# Patient Record
Sex: Female | Born: 1980 | Race: White | Hispanic: No | Marital: Married | Smoking: Never smoker
Health system: Southern US, Community
[De-identification: ages and names within clinical notes are randomized; demographics above are authoritative.]

---
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
## 2002-01-15 ENCOUNTER — Encounter: Payer: Self-pay | Admitting: Emergency Medicine

## 2002-01-15 ENCOUNTER — Observation Stay (HOSPITAL_COMMUNITY): Admission: EM | Admit: 2002-01-15 | Discharge: 2002-01-16 | Payer: Self-pay | Admitting: Emergency Medicine

## 2010-03-17 ENCOUNTER — Emergency Department (HOSPITAL_COMMUNITY)
Admission: EM | Admit: 2010-03-17 | Discharge: 2010-03-17 | Payer: Self-pay | Source: Home / Self Care | Admitting: Emergency Medicine

## 2010-06-26 LAB — POCT I-STAT, CHEM 8
BUN: 25 mg/dL — ABNORMAL HIGH (ref 6–23)
Calcium, Ion: 1.18 mmol/L (ref 1.12–1.32)
Chloride: 104 mEq/L (ref 96–112)
Creatinine, Ser: 1 mg/dL (ref 0.4–1.2)
Glucose, Bld: 108 mg/dL — ABNORMAL HIGH (ref 70–99)
HCT: 41 % (ref 36.0–46.0)
Hemoglobin: 13.9 g/dL (ref 12.0–15.0)
Potassium: 4.1 mEq/L (ref 3.5–5.1)
Sodium: 141 mEq/L (ref 135–145)
TCO2: 31 mmol/L (ref 0–100)

## 2010-06-26 LAB — CBC
HCT: 38.8 % (ref 36.0–46.0)
Hemoglobin: 12.9 g/dL (ref 12.0–15.0)
MCH: 31.2 pg (ref 26.0–34.0)
MCHC: 33.2 g/dL (ref 30.0–36.0)
MCV: 93.9 fL (ref 78.0–100.0)
Platelets: 251 10*3/uL (ref 150–400)
RBC: 4.13 MIL/uL (ref 3.87–5.11)
RDW: 12.4 % (ref 11.5–15.5)
WBC: 6.2 10*3/uL (ref 4.0–10.5)

## 2010-06-26 LAB — DIFFERENTIAL
Basophils Absolute: 0.1 10*3/uL (ref 0.0–0.1)
Basophils Relative: 2 % — ABNORMAL HIGH (ref 0–1)
Eosinophils Absolute: 0.2 10*3/uL (ref 0.0–0.7)
Eosinophils Relative: 3 % (ref 0–5)
Lymphocytes Relative: 29 % (ref 12–46)
Lymphs Abs: 1.8 10*3/uL (ref 0.7–4.0)
Monocytes Absolute: 0.5 10*3/uL (ref 0.1–1.0)
Monocytes Relative: 7 % (ref 3–12)
Neutro Abs: 3.6 10*3/uL (ref 1.7–7.7)
Neutrophils Relative %: 59 % (ref 43–77)

## 2010-06-26 LAB — URINALYSIS, ROUTINE W REFLEX MICROSCOPIC
Bilirubin Urine: NEGATIVE
Leukocytes, UA: NEGATIVE
Nitrite: NEGATIVE
Specific Gravity, Urine: 1.023 (ref 1.005–1.030)
pH: 6.5 (ref 5.0–8.0)

## 2010-06-26 LAB — D-DIMER, QUANTITATIVE: D-Dimer, Quant: <0.22 ug/mL-FEU (ref 0.00–0.48)

## 2010-06-26 LAB — URINE MICROSCOPIC-ADD ON

## 2010-09-01 NOTE — Op Note (Signed)
   NAMERANDILYN, FOISY NO.:  [id number]   MEDICAL RECORD NO.:  [id number]                   PATIENT TYPE:  INP   LOCATION:  0457                                 FACILITY:  [facility]   PHYSICIAN:  Roseanna Rainbow, M.D.         DATE OF BIRTH:  29-Jun-1980   DATE OF PROCEDURE:  01/15/2002  DATE OF DISCHARGE:  01/16/2002                                 OPERATIVE REPORT   PREOPERATIVE DIAGNOSIS:  Vaginal laceration.   POSTOPERATIVE DIAGNOSIS:  Vaginal laceration.   PROCEDURE:  Repair of vaginal laceration.   SURGEON:  Roseanna Rainbow, M.D.   ANESTHESIA:  Laryngeal mask airway.   COMPLICATIONS:  None.   ESTIMATED BLOOD LOSS:  Less than 50 cc secondary to the procedure.   FINDINGS:  Blood clot and hematoma noted at the vaginal apex.  After this  was evacuated, there was a several centimeter transverse tear in the vaginal  apex.  There were generalized oozing from the vaginal epithelial edges;  however, there were no active bleeding sites noted.  The cervix was absent.   DESCRIPTION OF PROCEDURE:  The patient was taken to the operating room.  A  sterile weighted speculum was placed in the patient's vagina, and Deavers  were used for retraction.  The hematoma was evacuated as described above  with suction.  The lacerated vaginal epithelial edges were then  reapproximated using interrupted figure-of-eight sutures of 2-0 Vicryl.  The  vagina was then irrigated.  Adequate hemostasis was noted.  The patient  tolerated the procedure well.  The patient was taken to the PACU in stable  condition.                                               Roseanna Rainbow, M.D.    Judee Clara  D:  01/16/2002  T:  01/16/2002  Job:  [id number]

## 2019-05-08 ENCOUNTER — Other Ambulatory Visit: Payer: Self-pay

## 2019-05-08 ENCOUNTER — Ambulatory Visit (INDEPENDENT_AMBULATORY_CARE_PROVIDER_SITE_OTHER): Payer: Self-pay | Admitting: Pediatrics

## 2019-05-08 DIAGNOSIS — Z7681 Expectant parent(s) prebirth pediatrician visit: Secondary | ICD-10-CM

## 2019-05-10 DIAGNOSIS — Z7681 Expectant parent(s) prebirth pediatrician visit: Secondary | ICD-10-CM | POA: Insufficient documentation

## 2019-05-10 NOTE — Progress Notes (Signed)
Prenatal counseling for impending newborn done-- Z76.81  

## 2019-07-07 ENCOUNTER — Other Ambulatory Visit: Payer: Self-pay

## 2019-07-07 ENCOUNTER — Emergency Department (HOSPITAL_BASED_OUTPATIENT_CLINIC_OR_DEPARTMENT_OTHER)
Admission: EM | Admit: 2019-07-07 | Discharge: 2019-07-07 | Disposition: A | Discharge status: Home / Self Care | Payer: BC Managed Care – PPO | Attending: Emergency Medicine | Admitting: Emergency Medicine

## 2019-07-07 ENCOUNTER — Emergency Department (HOSPITAL_BASED_OUTPATIENT_CLINIC_OR_DEPARTMENT_OTHER): Payer: BC Managed Care – PPO

## 2019-07-07 ENCOUNTER — Encounter (HOSPITAL_BASED_OUTPATIENT_CLINIC_OR_DEPARTMENT_OTHER): Payer: Self-pay | Admitting: *Deleted

## 2019-07-07 DIAGNOSIS — R591 Generalized enlarged lymph nodes: Secondary | ICD-10-CM

## 2019-07-07 DIAGNOSIS — R599 Enlarged lymph nodes, unspecified: Secondary | ICD-10-CM | POA: Diagnosis not present

## 2019-07-07 DIAGNOSIS — Z8543 Personal history of malignant neoplasm of ovary: Secondary | ICD-10-CM | POA: Diagnosis not present

## 2019-07-07 DIAGNOSIS — R221 Localized swelling, mass and lump, neck: Secondary | ICD-10-CM | POA: Diagnosis present

## 2019-07-07 LAB — CBC WITH DIFFERENTIAL/PLATELET
Abs Immature Granulocytes: 0.01 10*3/uL (ref 0.00–0.07)
Basophils Absolute: 0.1 10*3/uL (ref 0.0–0.1)
Basophils Relative: 1 %
Eosinophils Absolute: 0.1 10*3/uL (ref 0.0–0.5)
Eosinophils Relative: 2 %
HCT: 40.8 % (ref 36.0–46.0)
Hemoglobin: 13.4 g/dL (ref 12.0–15.0)
Immature Granulocytes: 0 %
Lymphocytes Relative: 32 %
Lymphs Abs: 1.9 10*3/uL (ref 0.7–4.0)
MCH: 30.7 pg (ref 26.0–34.0)
MCHC: 32.8 g/dL (ref 30.0–36.0)
MCV: 93.4 fL (ref 80.0–100.0)
Monocytes Absolute: 0.4 10*3/uL (ref 0.1–1.0)
Monocytes Relative: 7 %
Neutro Abs: 3.3 10*3/uL (ref 1.7–7.7)
Neutrophils Relative %: 58 %
Platelets: 233 10*3/uL (ref 150–400)
RBC: 4.37 MIL/uL (ref 3.87–5.11)
RDW: 11.9 % (ref 11.5–15.5)
WBC: 5.8 10*3/uL (ref 4.0–10.5)
nRBC: 0 % (ref 0.0–0.2)

## 2019-07-07 LAB — COMPREHENSIVE METABOLIC PANEL
ALT: 22 U/L (ref 0–44)
AST: 29 U/L (ref 15–41)
Albumin: 4.8 g/dL (ref 3.5–5.0)
Alkaline Phosphatase: 56 U/L (ref 38–126)
Anion gap: 11 (ref 5–15)
BUN: 11 mg/dL (ref 6–20)
CO2: 25 mmol/L (ref 22–32)
Calcium: 9.5 mg/dL (ref 8.9–10.3)
Chloride: 104 mmol/L (ref 98–111)
Creatinine, Ser: 0.83 mg/dL (ref 0.44–1.00)
GFR calc Af Amer: >60 mL/min (ref >60)
GFR calc non Af Amer: >60 mL/min (ref >60)
Glucose, Bld: 89 mg/dL (ref 70–99)
Potassium: 3.7 mmol/L (ref 3.5–5.1)
Sodium: 140 mmol/L (ref 135–145)
Total Bilirubin: 0.8 mg/dL (ref 0.3–1.2)
Total Protein: 7.7 g/dL (ref 6.5–8.1)

## 2019-07-07 MED ORDER — IOHEXOL 300 MG/ML  SOLN
100.0000 mL | Freq: Once | INTRAMUSCULAR | Status: AC
  Administered 2019-07-07: 20:00:00 100 mL via INTRAVENOUS

## 2019-07-07 NOTE — ED Notes (Signed)
2 unsuccessful IV attempts. Misty RN notified

## 2019-07-07 NOTE — Progress Notes (Signed)
CT pending lab results  

## 2019-07-07 NOTE — ED Triage Notes (Signed)
She has a swollen node in the left side of her neck. States she got a TDAP in her left arm prior. Hx of ovarian cancer.

## 2019-07-07 NOTE — Discharge Instructions (Signed)
You are seen today for an enlarged lymph node.  Your work-up was reassuring including a normal metabolic panel and complete blood count.  You did have some nonpathologically enlarged lymph nodes in your neck and supraclavicular area.  He also had a borderline left axillary lymph node.  This is all still likely reactive to the vaccines a received in your left arm.  However, given your family history we will refer you to IR.  It is still okay to watch and wait and follow-up with primary care doctor if you feel comfortable.  Otherwise call IR for possible biopsy. Thank you for allowing me to care for you today. Please return to the emergency department if you have new or worsening symptoms. Take your medications as instructed.

## 2019-07-07 NOTE — ED Provider Notes (Signed)
[facility] EMERGENCY DEPARTMENT Provider Note   CSN: [id number] Arrival date & time: 07/07/19  1747     History Chief Complaint  Patient presents with  . Swollen Lymph Node    Erika Hall is a 39 y.o. female.  Patient is a 39 year old female with a past medical history that is significant for ovarian cancer at the age of 39 years old, and a strong family history of various cancers including lymphoma, leukemia, liver cancer with multiple familial deaths at young ages presenting to the emergency department for lymphadenopathy.  Patient reports that she frequently does have some neck exam on herself to feel for lymphadenopathy given her history and family history.  Reports that yesterday she felt a small supraclavicular lymph node on the left side.  She does admit that she had a tetanus shot last Friday in the left arm.  She reports feeling postnasal drip chronically but has been evaluated by ENT in the past for the same.  Patient reports being very worrisome that she may have a metastatic or cancerous process going on due to the lymph node.  She denies any chest pain, shortness of breath, cough.  Reports a 15 pound weight loss since January but this was intentional.        History reviewed. No pertinent past medical history.  Patient Active Problem List   Diagnosis Date Noted  . Pediatric pre-birth visit 05/10/2019    History reviewed. No pertinent surgical history.   OB History   No obstetric history on file.     No family history on file.  Social History   Tobacco Use  . Smoking status: Never Smoker  . Smokeless tobacco: Never Used  Substance Use Topics  . Alcohol use: Not on file  . Drug use: Not on file    Home Medications Prior to Admission medications   Not on File    Allergies    Patient has no known allergies.  Review of Systems   Review of Systems  Constitutional: Negative for appetite change, chills and fever.  HENT: Negative for  congestion and sore throat.   Respiratory: Negative for cough and shortness of breath.   Gastrointestinal: Negative for abdominal pain and vomiting.  Musculoskeletal: Negative for arthralgias and neck pain.  Skin: Negative for rash.  Neurological: Negative for dizziness and headaches.  Hematological: Positive for adenopathy.    Physical Exam Updated Vital Signs BP 118/87   Pulse 92   Temp 97.9 F (36.6 C) (Oral)   Resp 16   Ht 5\' 11"  (1.803 m)   Wt 87 kg   SpO2 98%   BMI 26.75 kg/m   Physical Exam Vitals and nursing note reviewed.  Constitutional:      General: She is not in acute distress.    Appearance: Normal appearance. She is not ill-appearing, toxic-appearing or diaphoretic.  HENT:     Head: Normocephalic.     Mouth/Throat:     Mouth: Mucous membranes are moist.     Pharynx: Oropharynx is clear.  Eyes:     Conjunctiva/sclera: Conjunctivae normal.  Neck:     Comments: Left side supracervical  Lymphadenopathy.  Lymph node is mobile, mildly tender and about 1 cm circumferentially. Cardiovascular:     Rate and Rhythm: Normal rate and regular rhythm.  Pulmonary:     Effort: Pulmonary effort is normal.     Breath sounds: Normal breath sounds.  Skin:    General: Skin is dry.  Neurological:  Mental Status: She is alert.  Psychiatric:        Mood and Affect: Mood normal.     ED Results / Procedures / Treatments   Labs (all labs ordered are listed, but only abnormal results are displayed) Labs Reviewed  COMPREHENSIVE METABOLIC PANEL  CBC WITH DIFFERENTIAL/PLATELET    EKG None  Radiology No results found.  Procedures Procedures (including critical care time)  Medications Ordered in ED Medications - No data to display  ED Course  I have reviewed the triage vital signs and the nursing notes.  Pertinent labs & imaging results that were available during my care of the patient were reviewed by me and considered in my medical decision making (see chart  for details).  Clinical Course as of Jul 07 2111  Tue Jul 07, 2019  1884 Patient with strong personal and family history of cancer presenting to the emergency department for lymphadenopathy supraclavicular found yesterday.  Patient is very concerned about a cancerous process.  We had a shared decision making conversation.  I reassured the patient that since this is new and since she recently had a tetanus shot it would be reasonable to watch and wait.  She is not having any other significant constitutional symptoms.  However, patient noted that she would like to have labs and CT scans done today.  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 She would like to proceed with labs and CT neck and chest.  I advised the patient that if work-up negative she will still need follow-up with primary care doctor and if lymph node is persistent may need biopsy in the future   [KM]  2108 Patient work-up was reassuring including normal CBC and CMP.  CT soft tissue of the neck reading lymph nodes that are not pathologically enlarged but are more prominent than the right side.  There is a left axillary lymph node which is borderline prominent.  I reassured the patient that I believe that this is also reactive from tetanus shot in the left arm.  Advised the patient that watching and waiting is still advisable.  Discussed with Dr. Lockie Mola.  We will give the patient the referral information for IR so she may follow-up with them for possible biopsy in the future.  Advised on strict return precautions.   [KM]    Clinical Course User Index [KM] Jeral Pinch   MDM Rules/Calculators/A&P                      Based on review of vitals, medical screening exam, lab work and/or imaging, there does not appear to be an acute, emergent etiology for the patient's symptoms. Counseled pt on good return precautions and encouraged both PCP and ED  follow-up as needed.  Prior to discharge, I also discussed incidental imaging findings with patient in detail and advised appropriate, recommended follow-up in detail.  Clinical Impression: 1. Lymphadenopathy     Disposition: Discharge  Prior to providing a prescription for a controlled substance, I independently reviewed the patient's recent prescription history on the [place] Controlled Substance Reporting System. The patient had no recent or regular prescriptions and was deemed appropriate for a brief, less than 3 day prescription of narcotic for acute analgesia.  This note was prepared with assistance of Conservation officer, historic buildings. Occasional wrong-word or sound-a-like substitutions may have occurred due to the inherent limitations of voice  recognition software.  Final Clinical Impression(s) / ED Diagnoses Final diagnoses:  None    Rx / DC Orders ED Discharge Orders    None       Kristine Royal 07/07/19 2114    Lennice Sites, DO 07/07/19 2312

## 2019-07-24 ENCOUNTER — Inpatient Hospital Stay (HOSPITAL_COMMUNITY): Admission: RE | Admit: 2019-07-24 | Payer: BC Managed Care – PPO | Source: Home / Self Care

## 2021-09-08 IMAGING — CT CT CHEST W/ CM
2 of 3 series · 15 of 36 positions shown, 18 images · IV contrast (Omnipaque)
Comparison: None.

CLINICAL DATA: Swollen lymph node in left side of neck

EXAM:
CT CHEST WITH CONTRAST
TECHNIQUE: Multidetector CT imaging of the chest was performed during
intravenous contrast administration.
CONTRAST:  100mL OMNIPAQUE IOHEXOL 300 MG/ML  SOLN

[Series 3: axial st · axial · 0.73mm/px · z∈[+493,+791]mm · 12 of 177 slices shown, 15 images]
[im 14/177  mediastinal]
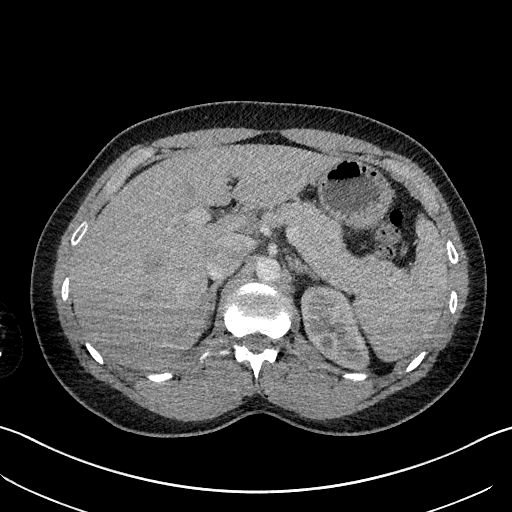
[im 14/177  lung]
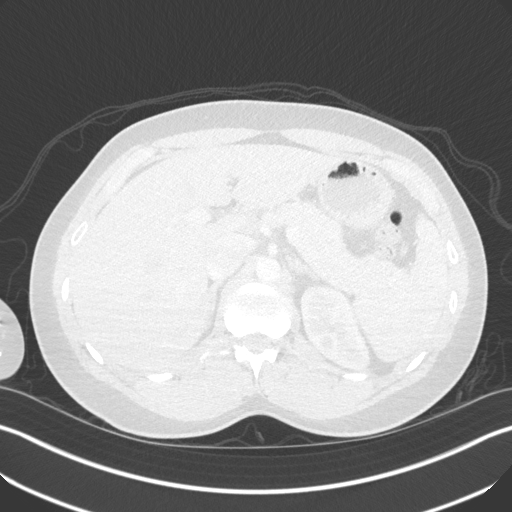
[im 27/177  lung]
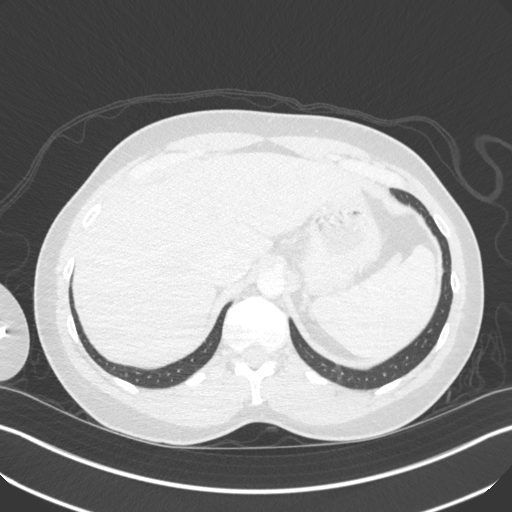
[im 40/177  lung]
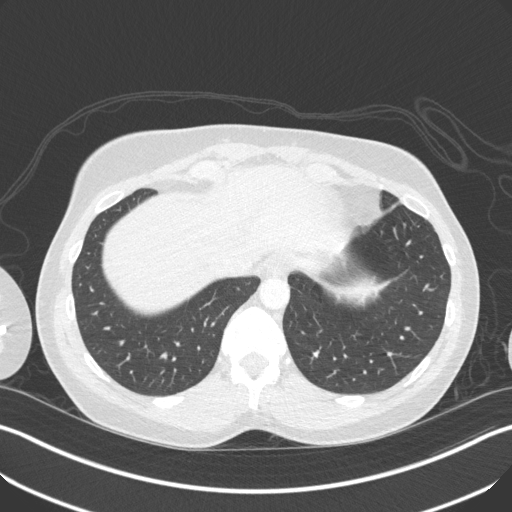
[im 53/177  lung]
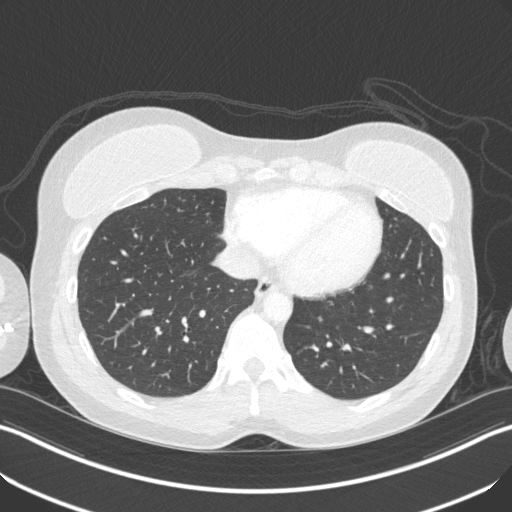
[im 66/177  mediastinal]
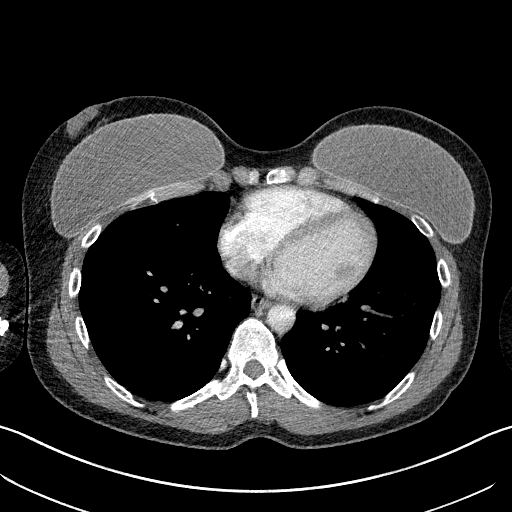
[im 66/177  lung]
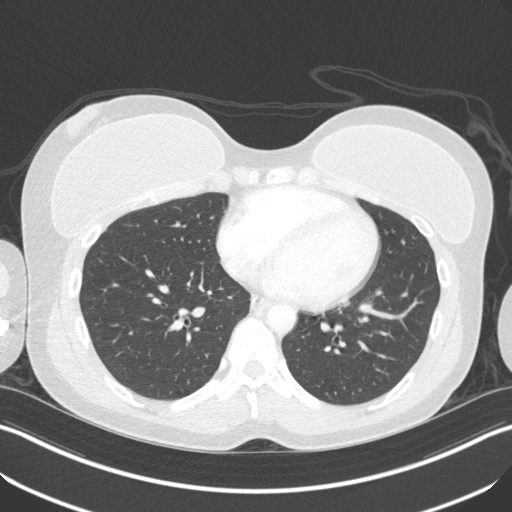
[im 79/177  lung]
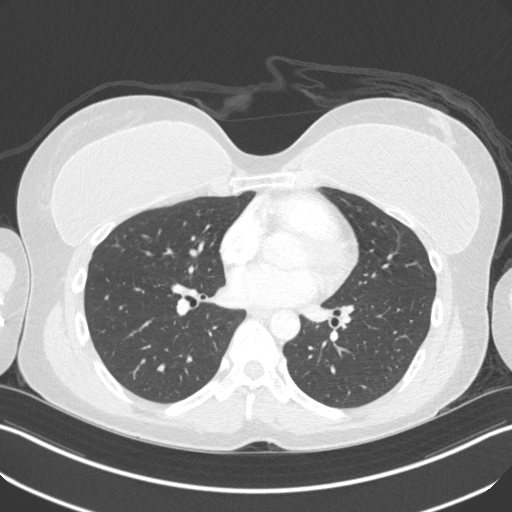
[im 98/177  lung]
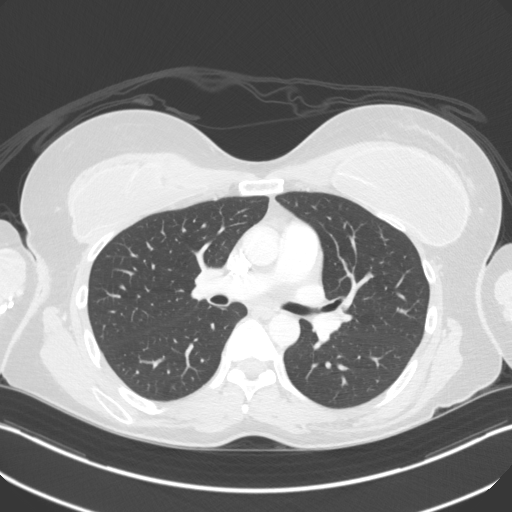
[im 111/177  lung]
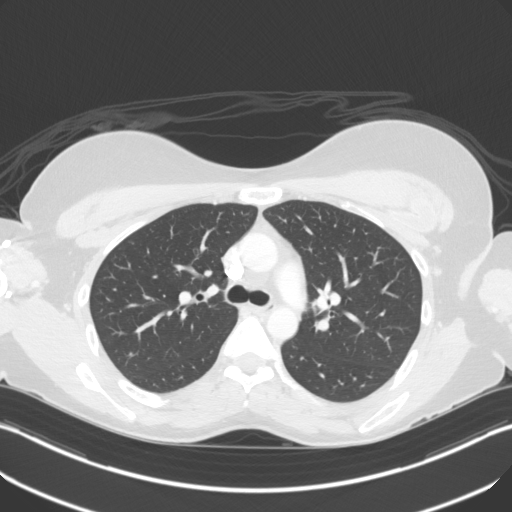
[im 124/177  mediastinal]
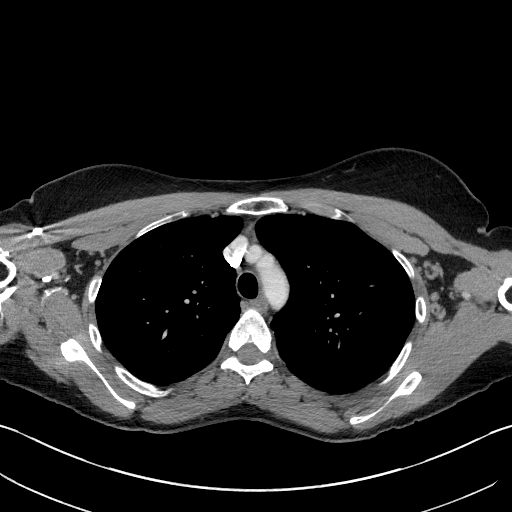
[im 124/177  lung]
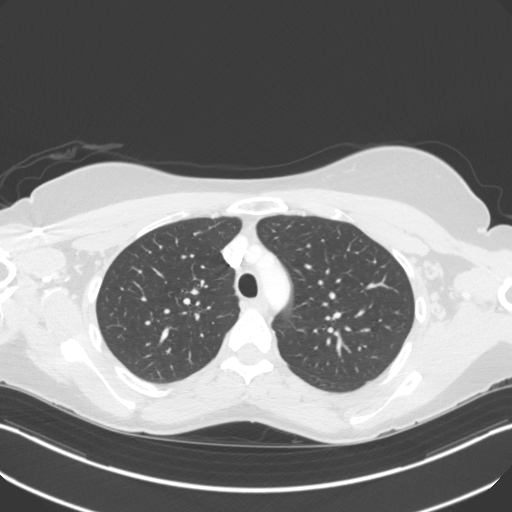
[im 137/177  lung]
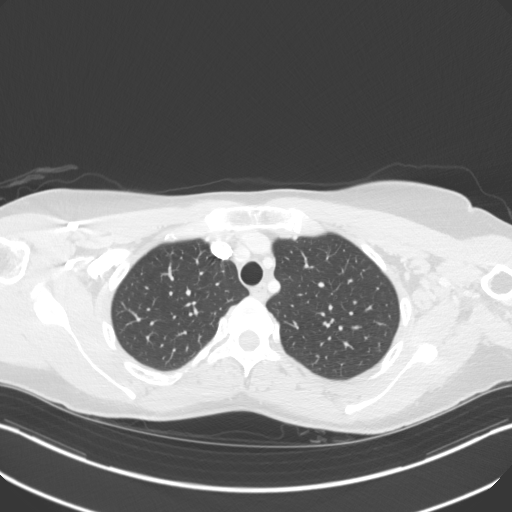
[im 150/177  lung]
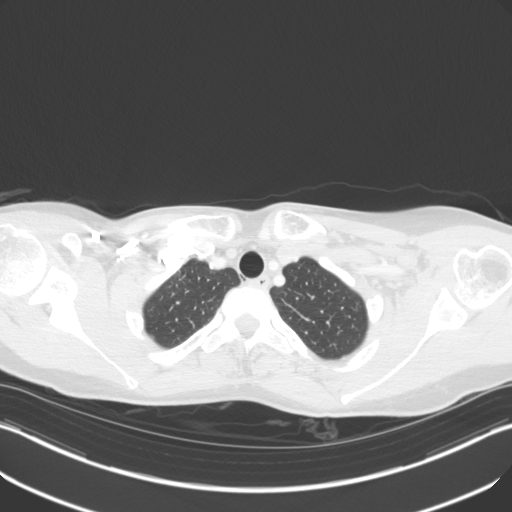
[im 163/177  lung]
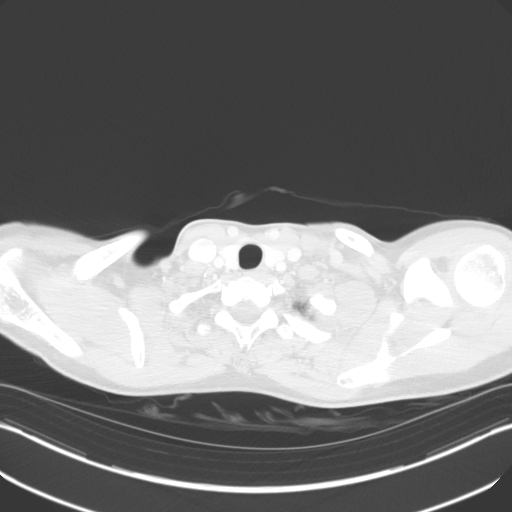

[Series 6: coronal · coronal · 0.65mm/px · 3 of 126 slices shown]
[im 26/126  lung]
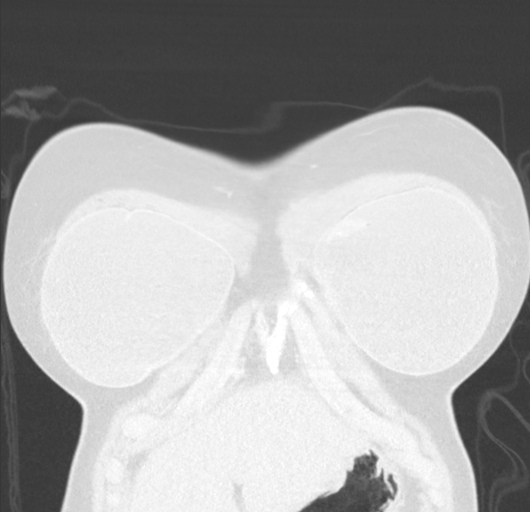
[im 51/126  lung]
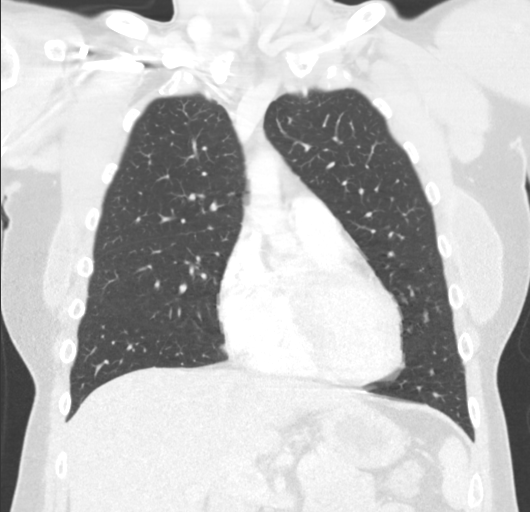
[im 76/126  lung]
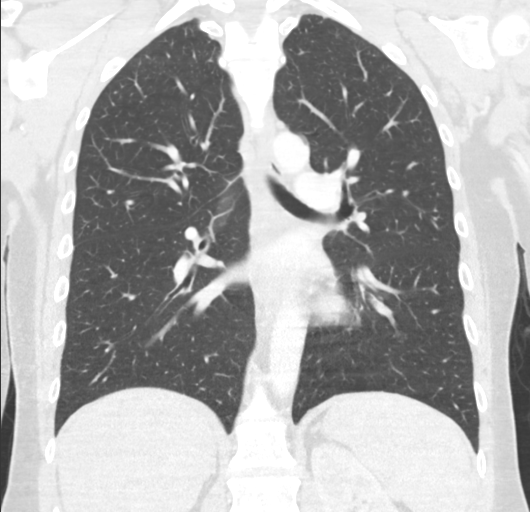

[15 of 36 positions shown; findings below may reference images not displayed]

FINDINGS: Cardiovascular: Heart is normal size. Aorta is normal caliber.

Mediastinum/Nodes: No mediastinal or hilar adenopathy. Prominent
left axillary lymph nodes. Index node has a short axis diameter of
11 mm. Small lymph nodes at the base of the neck on the left with
index node having a short axis diameter of 4 mm. No axillary
adenopathy on the right.

Lungs/Pleura: Lungs are clear. No focal airspace opacities or
suspicious nodules. No effusions.

Upper Abdomen: Imaging into the upper abdomen shows no acute
findings.

Musculoskeletal: Bilateral breast implants. No acute bony
abnormality.
IMPRESSION: Mildly prominent borderline sized left axillary lymph nodes. Few
small non pathologically enlarged lymph nodes seen within the left
neck base. No mediastinal or hilar adenopathy.

Otherwise no acute cardiopulmonary disease.

## 2021-09-08 IMAGING — CT CT NECK W/ CM
3 of 4 series · 13 of 33 positions shown, 16 images · IV contrast (Omnipaque)
Comparison: None.

CLINICAL DATA: Initial evaluation for enlarged left-sided lymph
node. History of ovarian cancer.

EXAM:
CT NECK WITH CONTRAST
TECHNIQUE: Multidetector CT imaging of the neck was performed using the
standard protocol following the bolus administration of intravenous
contrast.
CONTRAST:  100mL OMNIPAQUE IOHEXOL 300 MG/ML  SOLN

[Series 2: axial neck · axial · 0.48mm/px · z∈[+737,+921]mm · 5 of 140 slices shown, 7 images]
[im 24/140  soft-tissue]
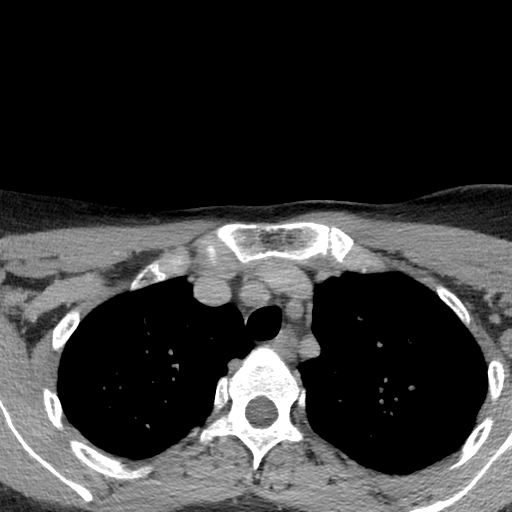
[im 24/140  bone]
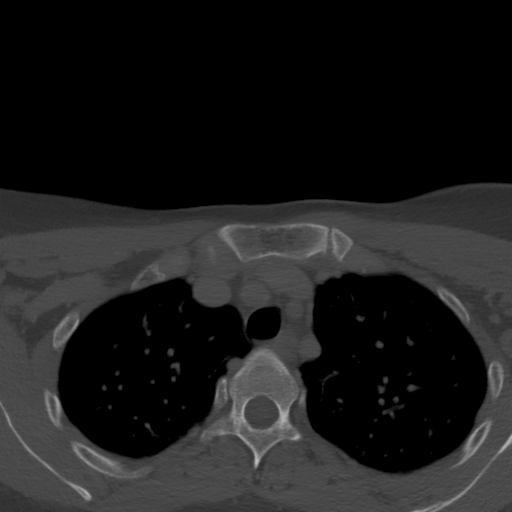
[im 47/140  bone]
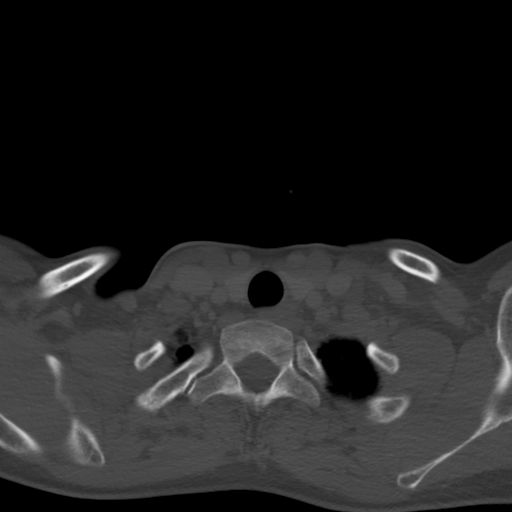
[im 70/140  bone]
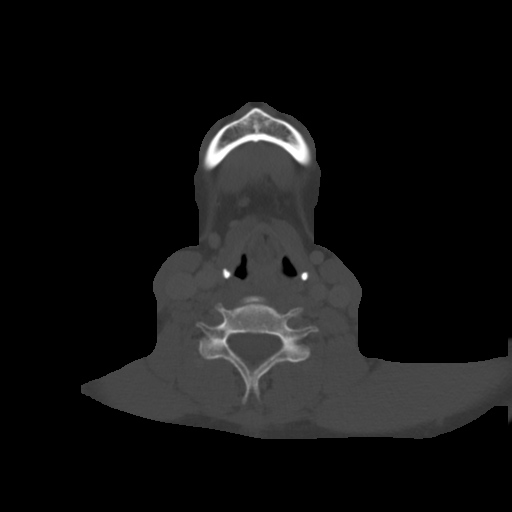
[im 93/140  bone]
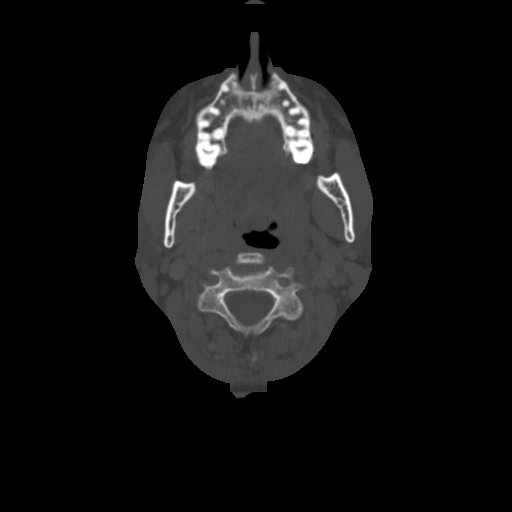
[im 116/140  soft-tissue]
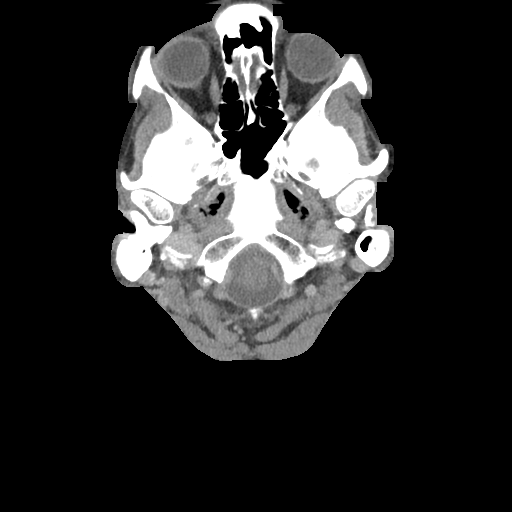
[im 116/140  bone]
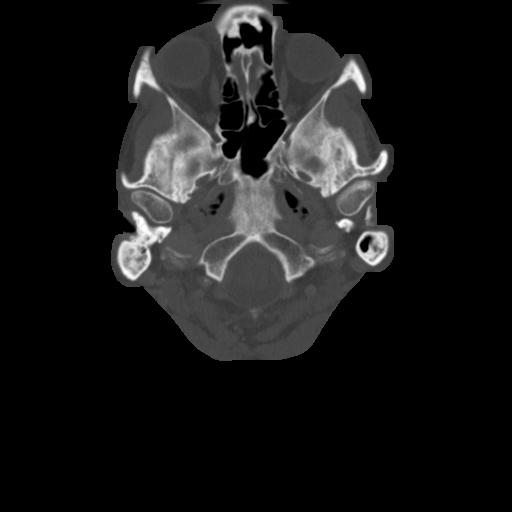

[Series 3: sag neck · sagittal · 0.52mm/px · 5 of 101 slices shown, 6 images]
[im 34/101  bone]
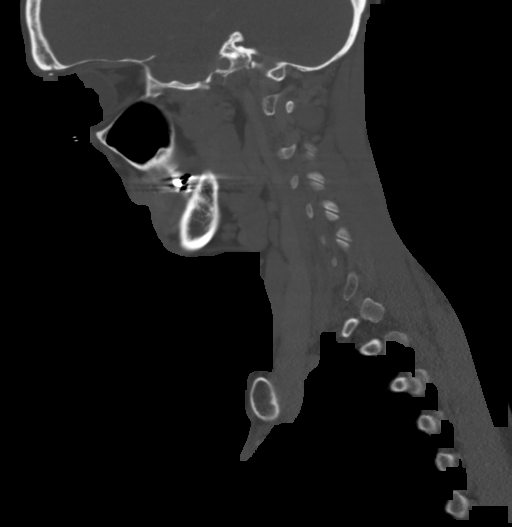
[im 42/101  bone]
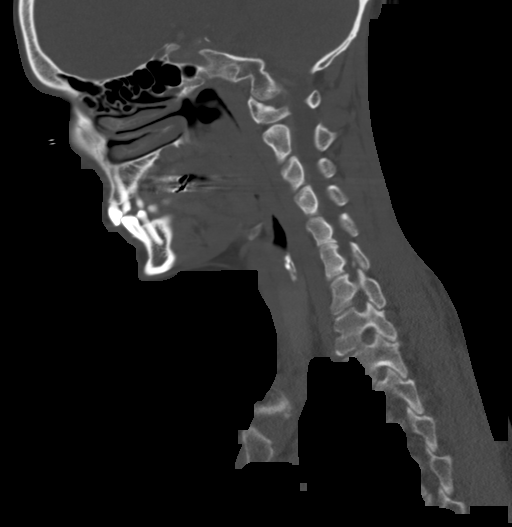
[im 51/101  soft-tissue]
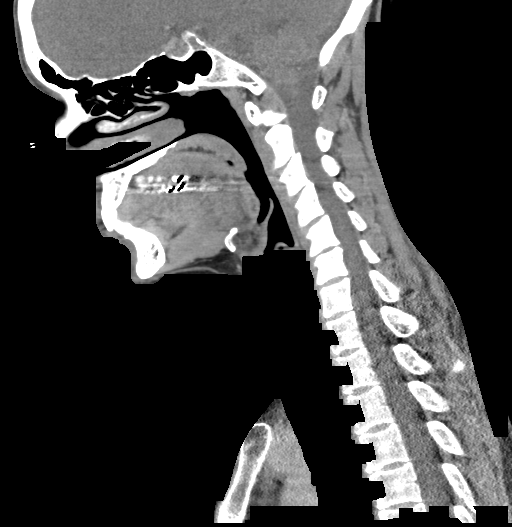
[im 51/101  bone]
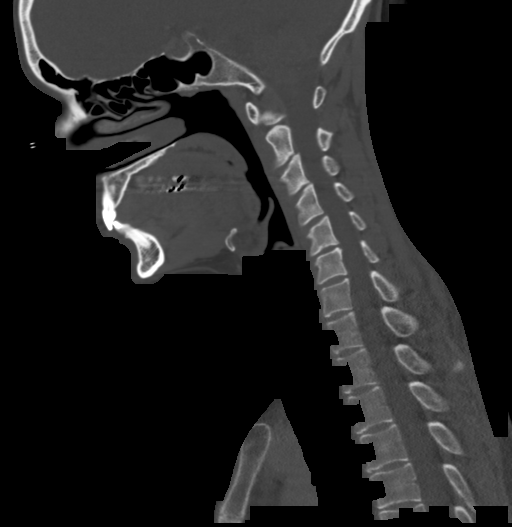
[im 59/101  bone]
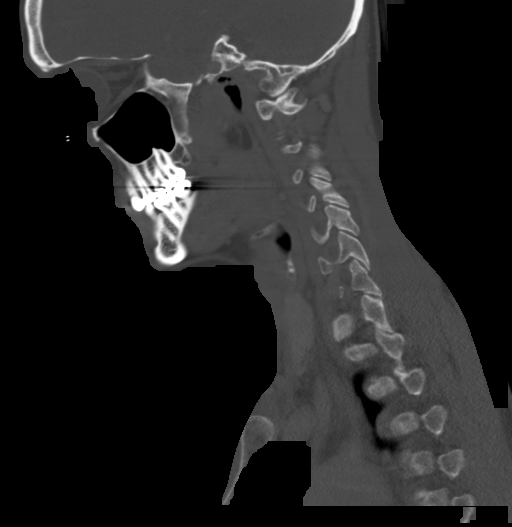
[im 67/101  bone]
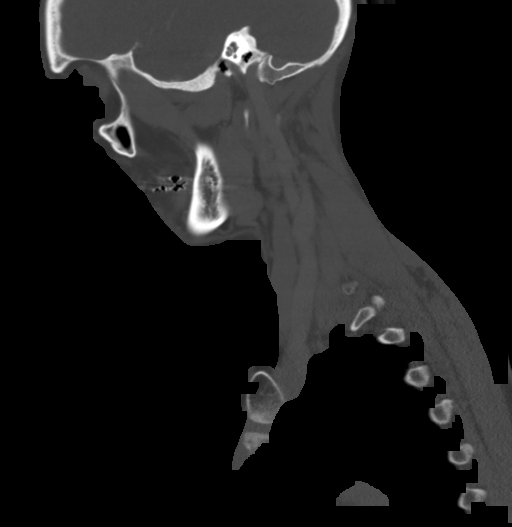

[Series 4: cor neck · coronal · 0.48mm/px · 3 of 128 slices shown]
[im 26/128  bone]
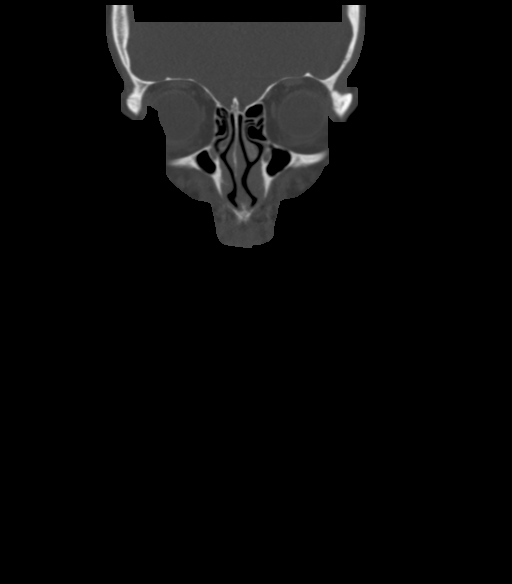
[im 51/128  bone]
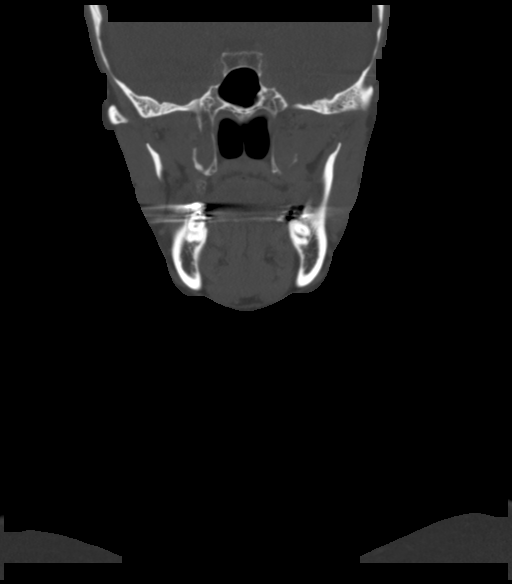
[im 77/128  bone]
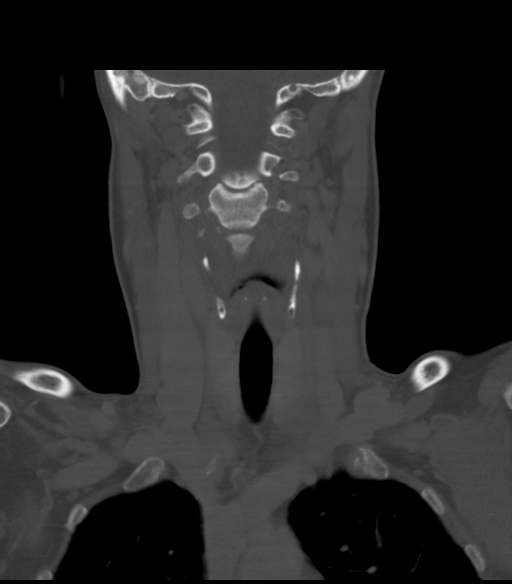

[13 of 33 positions shown; findings below may reference images not displayed]

FINDINGS: Pharynx and larynx: Oral cavity within normal limits without
discrete mass or loculated collection. No acute inflammatory changes
seen about the dentition. Palatine tonsils symmetric and within
normal limits. Parapharyngeal fat maintained. Remainder of the
oropharynx and nasopharynx within normal limits. No retropharyngeal
collection. Epiglottis normal. Vallecula clear. Remainder of the
hypopharynx and supraglottic larynx within normal limits. Glottis
unremarkable. Subglottic airway clear.

Salivary glands: Salivary glands including the parotid and
submandibular glands are within normal limits.

Thyroid: Thyroid within normal limits.

Lymph nodes: Metallic BB marker overlies the palpable abnormality
concern at the left lower neck/left supraclavicular region.
Immediately subjacent to this there is a nonenlarged 6-7 mm lymph
node within the left supraclavicular fossa (series 2, image 82). Few
additional. Scattered subcentimeter nodes noted within the adjacent
left supraclavicular region. Left level II a node measures at the
upper limits of normal at 1 cm in short axis. Overall, subcentimeter
left-sided cervical and supraclavicular nodes are asymmetrically
prominent as compared to the right neck. No pathologically enlarged
lymph nodes seen within the neck. Few nonenlarged left axillary
nodes measuring up to 6 mm noted. No other enlarged adenopathy
within the visualized upper chest.

Vascular: Normal intravascular enhancement seen throughout the neck.

Limited intracranial: Unremarkable.

Visualized orbits: Globes and orbital soft tissues within normal
limits.

Mastoids and visualized paranasal sinuses: Visualized paranasal
sinuses are clear. Mastoid air cells and middle ear cavities are
well pneumatized and free of fluid.

Skeleton: No acute osseous abnormality. No discrete or worrisome
osseous lesions. Mild degenerative spondylosis noted at C4-5 through
C6-7.

Upper chest: Grossly unremarkable, although better evaluated on
concomitant CT of the chest.

Other: None.
IMPRESSION: 1. Nonenlarged 6-7 mm lymph node within the left supraclavicular
fossa, corresponding with palpable abnormality of concern. Overall,
shotty subcentimeter left-sided cervical and supraclavicular nodes
are asymmetrically prominent as compared to the right. Findings are
nonspecific, and may be reactive in nature. Clinical follow-up to
resolution recommended. No pathologically enlarged lymph nodes
within the neck.
2. No other acute abnormality within the neck.

## 2023-11-15 ENCOUNTER — Ambulatory Visit (INDEPENDENT_AMBULATORY_CARE_PROVIDER_SITE_OTHER): Admitting: Orthopaedic Surgery

## 2023-11-15 ENCOUNTER — Ambulatory Visit (INDEPENDENT_AMBULATORY_CARE_PROVIDER_SITE_OTHER)

## 2023-11-15 ENCOUNTER — Ambulatory Visit: Payer: Self-pay | Admitting: General Surgery

## 2023-11-15 DIAGNOSIS — G8929 Other chronic pain: Secondary | ICD-10-CM | POA: Diagnosis not present

## 2023-11-15 DIAGNOSIS — M25512 Pain in left shoulder: Secondary | ICD-10-CM

## 2023-11-15 MED ORDER — LIDOCAINE HCL 1 % IJ SOLN
4.0000 mL | INTRAMUSCULAR | Status: AC | PRN
  Administered 2023-11-15: 4 mL

## 2023-11-15 MED ORDER — TRIAMCINOLONE ACETONIDE 40 MG/ML IJ SUSP
80.0000 mg | INTRAMUSCULAR | Status: AC | PRN
  Administered 2023-11-15: 80 mg via INTRA_ARTICULAR

## 2023-11-15 NOTE — Progress Notes (Signed)
 Chief Complaint: Left shoulder pain     History of Present Illness:    Erika Hall is a 43 y.o. female presents today with 1 month of atraumatic left shoulder pain which is worse with reaching.  She has been having a very difficult time laying directly on the side.  This is her nondominant hand.  Endorses pain deep in the shoulder joint.    PMH/PSH/Family History/Social History/Meds/Allergies:   No past medical history on file. No past surgical history on file. Social History   Socioeconomic History  . Marital status: Married    Spouse name: Not on file  . Number of children: Not on file  . Years of education: Not on file  . Highest education level: Not on file  Occupational History  . Not on file  Tobacco Use  . Smoking status: Never  . Smokeless tobacco: Never  Substance and Sexual Activity  . Alcohol use: Not on file  . Drug use: Not on file  . Sexual activity: Not on file  Other Topics Concern  . Not on file  Social History Narrative  . Not on file   Social Drivers of Health   Financial Resource Strain: Low Risk  (03/08/2023)   Received from [facility]   Overall Financial Resource Strain (CARDIA)   . Difficulty of Paying Living Expenses: Not hard at all  Food Insecurity: No Food Insecurity (03/08/2023)   Received from [facility]   Hunger Vital Sign   . Within the past 12 months, you worried that your food would run out before you got the money to buy more.: Never true   . Within the past 12 months, the food you bought just didn't last and you didn't have money to get more.: Never true  Transportation Needs: No Transportation Needs (03/08/2023)   Received from [facility] - Transportation   . Lack of Transportation (Medical): No   . Lack of Transportation (Non-Medical): No  Physical Activity: Sufficiently Active (06/14/2021)   Received from [facility]   Exercise Vital Sign   . On average, how many days per week do you engage in  moderate to strenuous exercise (like a brisk walk)?: 4 days   . On average, how many minutes do you engage in exercise at this level?: 40 min  Stress: No Stress Concern Present (06/14/2021)   Received from [facility] - Occupational Stress Questionnaire   . Feeling of Stress : Only a little  Social Connections: Unknown (08/29/2021)   Received from [facility]   Social Network   . Social Network: Not on file   No family history on file. No Known Allergies No current outpatient medications on file.   No current facility-administered medications for this visit.   No results found.  Review of Systems:   A ROS was performed including pertinent positives and negatives as documented in the HPI.  Physical Exam :   Constitutional: NAD and appears stated age Neurological: Alert and oriented Psych: Appropriate affect and cooperative There were no vitals taken for this visit.   Comprehensive Musculoskeletal Exam:    Left shoulder pain with tenderness about the glenohumeral joint.  External rotation of the side is to 40 compared to 60 on the contralateral side with pain with passive external rotation.  Active forward elevation is 160 degrees.   Imaging:   Xray (3 views left shoulder): Normal    I personally reviewed and interpreted  the radiographs.   Assessment and Plan:   43 y.o. female with left shoulder pain consistent with adhesive capsulitis.  At today's visit I did recommend an ultrasound-guided injection of the glenohumeral joint.  She would like to proceed with this today.  I will plan to see her back in 2 weeks for reassessment  -Left shoulder ultrasound-guided injection Friday after verbal consent obtained    Procedure Note  Patient: Erika Hall             Date of Birth: 1980-05-15           MRN: [id number]             Visit Date: 11/15/2023  Procedures: Visit Diagnoses:  1. Chronic left shoulder pain     Large  Joint Inj: L glenohumeral on 11/15/2023 12:05 PM Indications: pain Details: 22 G 1.5 in needle, ultrasound-guided anterior approach  Arthrogram: No  Medications: 4 mL lidocaine 1 %; 80 mg triamcinolone acetonide 40 MG/ML Outcome: tolerated well, no immediate complications Procedure, treatment alternatives, risks and benefits explained, specific risks discussed. Consent was given by the patient. Immediately prior to procedure a time out was called to verify the correct patient, procedure, equipment, support staff and site/side marked as required. Patient was prepped and draped in the usual sterile fashion.        I personally saw and evaluated the patient, and participated in the management and treatment plan.  Elspeth Parker, MD Attending Physician, Orthopedic Surgery  This document was dictated using Dragon voice recognition software. A reasonable attempt at proof reading has been made to minimize errors.

## 2023-11-29 ENCOUNTER — Ambulatory Visit (HOSPITAL_BASED_OUTPATIENT_CLINIC_OR_DEPARTMENT_OTHER): Admitting: Orthopaedic Surgery

## 2023-11-29 DIAGNOSIS — M25512 Pain in left shoulder: Secondary | ICD-10-CM

## 2023-11-29 DIAGNOSIS — G8929 Other chronic pain: Secondary | ICD-10-CM

## 2023-11-29 NOTE — Progress Notes (Signed)
 Chief Complaint: Left shoulder pain     History of Present Illness:   11/29/2023: Presents today for follow-up of left shoulder.  She has 100% relief  Erika Hall is a 43 y.o. female presents today with 1 month of atraumatic left shoulder pain which is worse with reaching.  She has been having a very difficult time laying directly on the side.  This is her nondominant hand.  Endorses pain deep in the shoulder joint.    PMH/PSH/Family History/Social History/Meds/Allergies:   No past medical history on file. No past surgical history on file. Social History   Socioeconomic History   Marital status: Married    Spouse name: Not on file   Number of children: Not on file   Years of education: Not on file   Highest education level: Not on file  Occupational History   Not on file  Tobacco Use   Smoking status: Never   Smokeless tobacco: Never  Substance and Sexual Activity   Alcohol use: Not on file   Drug use: Not on file   Sexual activity: Not on file  Other Topics Concern   Not on file  Social History Narrative   Not on file   Social Drivers of Health   Financial Resource Strain: Low Risk  (03/08/2023)   Received from [facility]   Overall Financial Resource Strain (CARDIA)    Difficulty of Paying Living Expenses: Not hard at all  Food Insecurity: No Food Insecurity (03/08/2023)   Received from [facility]   Hunger Vital Sign    Within the past 12 months, you worried that your food would run out before you got the money to buy more.: Never true    Within the past 12 months, the food you bought just didn't last and you didn't have money to get more.: Never true  Transportation Needs: No Transportation Needs (03/08/2023)   Received from [facility] - Transportation    Lack of Transportation (Medical): No    Lack of Transportation (Non-Medical): No  Physical Activity: Sufficiently Active (06/14/2021)   Received from [facility]   Exercise Vital  Sign    On average, how many days per week do you engage in moderate to strenuous exercise (like a brisk walk)?: 4 days    On average, how many minutes do you engage in exercise at this level?: 40 min  Stress: No Stress Concern Present (06/14/2021)   Received from [facility] - Occupational Stress Questionnaire    Feeling of Stress : Only a little  Social Connections: Unknown (08/29/2021)   Received from [facility]   Social Network    Social Network: Not on file   No family history on file. No Known Allergies No current outpatient medications on file.   No current facility-administered medications for this visit.   No results found.  Review of Systems:   A ROS was performed including pertinent positives and negatives as documented in the HPI.  Physical Exam :   Constitutional: NAD and appears stated age Neurological: Alert and oriented Psych: Appropriate affect and cooperative There were no vitals taken for this visit.   Comprehensive Musculoskeletal Exam:    Left shoulder pain with tenderness about the glenohumeral joint.  External rotation of the side is to 40 compared to 60 on the contralateral side with pain with passive external rotation.  Active forward elevation is 160 degrees.   Imaging:   Xray (  3 views left shoulder): Normal    I personally reviewed and interpreted the radiographs.   Assessment and Plan:   43 y.o. female with left shoulder pain consistent with adhesive capsulitis.  She did get complete relief with her injection.  Will plan to see her back as needed  - Return to clinic as needed   I personally saw and evaluated the patient, and participated in the management and treatment plan.  Elspeth Parker, MD Attending Physician, Orthopedic Surgery  This document was dictated using Dragon voice recognition software. A reasonable attempt at proof reading has been made to minimize errors.

## 2024-02-17 ENCOUNTER — Encounter: Payer: Self-pay | Admitting: Radiology
